# Patient Record
Sex: Male | Born: 1990 | Race: White | Hispanic: No | Marital: Single | Smoking: Never smoker
Health system: Southern US, Community
[De-identification: ages and names within clinical notes are randomized; demographics above are authoritative.]

---
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
## 2012-03-03 ENCOUNTER — Emergency Department (HOSPITAL_COMMUNITY)
Admission: EM | Admit: 2012-03-03 | Discharge: 2012-03-03 | Disposition: A | Discharge status: Home / Self Care | Payer: 59 | Attending: Emergency Medicine | Admitting: Emergency Medicine

## 2012-03-03 ENCOUNTER — Emergency Department (HOSPITAL_COMMUNITY): Payer: 59

## 2012-03-03 ENCOUNTER — Encounter (HOSPITAL_COMMUNITY): Payer: Self-pay | Admitting: *Deleted

## 2012-03-03 DIAGNOSIS — B9789 Other viral agents as the cause of diseases classified elsewhere: Secondary | ICD-10-CM | POA: Insufficient documentation

## 2012-03-03 DIAGNOSIS — B349 Viral infection, unspecified: Secondary | ICD-10-CM

## 2012-03-03 DIAGNOSIS — R109 Unspecified abdominal pain: Secondary | ICD-10-CM | POA: Insufficient documentation

## 2012-03-03 DIAGNOSIS — R110 Nausea: Secondary | ICD-10-CM | POA: Insufficient documentation

## 2012-03-03 DIAGNOSIS — R0602 Shortness of breath: Secondary | ICD-10-CM | POA: Insufficient documentation

## 2012-03-03 DIAGNOSIS — R079 Chest pain, unspecified: Secondary | ICD-10-CM | POA: Insufficient documentation

## 2012-03-03 LAB — LIPASE, BLOOD: Lipase: 29 U/L (ref 11–59)

## 2012-03-03 LAB — BASIC METABOLIC PANEL
CO2: 23 mEq/L (ref 19–32)
Glucose, Bld: 97 mg/dL (ref 70–99)
Potassium: 3.8 mEq/L (ref 3.5–5.1)
Sodium: 135 mEq/L (ref 135–145)

## 2012-03-03 LAB — HEPATIC FUNCTION PANEL
Albumin: 3.9 g/dL (ref 3.5–5.2)
Total Protein: 6.9 g/dL (ref 6.0–8.3)

## 2012-03-03 LAB — CBC
HCT: 45.4 % (ref 39.0–52.0)
Hemoglobin: 16.2 g/dL (ref 13.0–17.0)
MCH: 31 pg (ref 26.0–34.0)
RBC: 5.23 MIL/uL (ref 4.22–5.81)

## 2012-03-03 LAB — POCT I-STAT TROPONIN I

## 2012-03-03 MED ORDER — ONDANSETRON 8 MG PO TBDP: 8.0000 mg | ORAL_TABLET | Freq: Three times a day (TID) | ORAL | Status: DC | PRN

## 2012-03-03 MED ORDER — OXYCODONE-ACETAMINOPHEN 5-325 MG PO TABS: 1.0000 | ORAL_TABLET | Freq: Four times a day (QID) | ORAL | Status: DC | PRN

## 2012-03-03 MED ORDER — KETOROLAC TROMETHAMINE 30 MG/ML IJ SOLN
30.0000 mg | Freq: Once | INTRAMUSCULAR | Status: AC
  Administered 2012-03-03: 30 mg via INTRAVENOUS
  Filled 2012-03-03: qty 1

## 2012-03-03 MED ORDER — SODIUM CHLORIDE 0.9 % IV BOLUS (SEPSIS)
1000.0000 mL | Freq: Once | INTRAVENOUS | Status: AC
  Administered 2012-03-03: 1000 mL via INTRAVENOUS

## 2012-03-03 MED ORDER — FENTANYL CITRATE 0.05 MG/ML IJ SOLN
50.0000 ug | Freq: Once | INTRAMUSCULAR | Status: AC
  Administered 2012-03-03: 50 ug via INTRAVENOUS
  Filled 2012-03-03: qty 2

## 2012-03-03 NOTE — ED Notes (Signed)
Pt c/o cramping in hands; pt c/o numbness and tingling throughout body. Pt mentating appropriately.

## 2012-03-03 NOTE — ED Notes (Signed)
Pt ambulatory upon d/c. Pt leaving with mother. Pt verbalized understanding of d/c teaching and has no further questions upon d/c. Pt does not appear to be in acute distress upon d/c.

## 2012-03-03 NOTE — ED Provider Notes (Signed)
History   This chart was scribed for [employer]. Rubin Payor, MD by Charolett Bumpers, ED Scribe. The patient was seen in room TR09C/TR09C. Patient's care was started at 1540.   CSN: [id number]  Arrival date & time 03/03/12  1259   First MD Initiated Contact with Patient 03/03/12 1540      Chief Complaint  Patient presents with  . Cough  . Chest Pain    The history is provided by the patient. No language interpreter was used.  Willie Gibson is a 21 y.o. male who presents to the Emergency Department complaining of constant, moderate mid sternal chest pain for the past 2 days. He states that the pain later radiated to his upper abdomen and sides. He states that he was seen at [facility] clinic, had a negative chest x-ray and told he had bronchitis and given a breathing treatment. He reports associated SOB, productive cough, nausea, decreased appetite. He denies any fevers, sore throat. He denies any sick contacts. He states that he is otherwise healthy and denies any h/o similar symptoms. He denies tobacco use but reports occasional alcohol use.   History reviewed. No pertinent past medical history.  History reviewed. No pertinent past surgical history.  History reviewed. No pertinent family history.  History  Substance Use Topics  . Smoking status: Not on file  . Smokeless tobacco: Not on file  . Alcohol Use: Yes     Comment: occ      Review of Systems  Constitutional: Positive for appetite change. Negative for fever.  HENT: Negative for sore throat.   Respiratory: Positive for cough and shortness of breath.   Cardiovascular: Positive for chest pain.  Gastrointestinal: Positive for nausea and abdominal pain.  All other systems reviewed and are negative.    Allergies  Review of patient's allergies indicates no known allergies.  Home Medications   Current Outpatient Rx  Name  Route  Sig  Dispense  Refill  . VITAMIN C PO   Oral   Take 1 tablet by mouth daily as needed.  When he remembers         . ONDANSETRON 8 MG PO TBDP   Oral   Take 1 tablet (8 mg total) by mouth every 8 (eight) hours as needed for nausea.   10 tablet   0   . OXYCODONE-ACETAMINOPHEN 5-325 MG PO TABS   Oral   Take 1-2 tablets by mouth every 6 (six) hours as needed for pain.   15 tablet   0     BP 108/64  Pulse 90  Temp 100 F (37.8 C) (Oral)  Resp 16  SpO2 98%  Physical Exam  Nursing note and vitals reviewed. Constitutional: He is oriented to person, place, and time. He appears well-developed and well-nourished.       Appears uncomfortable.   HENT:  Head: Normocephalic and atraumatic.  Mouth/Throat: Posterior oropharyngeal erythema present. No oropharyngeal exudate.  Eyes: EOM are normal.  Neck: Neck supple. No tracheal deviation present.  Cardiovascular: Regular rhythm and normal heart sounds.  Tachycardia present.   No murmur heard. Pulmonary/Chest: Effort normal and breath sounds normal. No respiratory distress. He has no wheezes. He has no rhonchi. He has no rales.  Abdominal: Soft. There is tenderness. There is no rebound and no guarding.       Mild upper abdominal tenderness.   Musculoskeletal: Normal range of motion.  Neurological: He is alert and oriented to person, place, and time.  Skin: Skin is warm  and dry.  Psychiatric: He has a normal mood and affect. His behavior is normal.    ED Course  Procedures (including critical care time)   COORDINATION OF CARE:  16:01-Discussed planned course of treatment with the patient including IV fluids, Toradol, a chest x-ray and blood work, who is agreeable at this time.   16:15-Medication Orders: Sodium chloride 0.9% bolus 1,000 mL-once; Ketorolac (Toradol) 30 mg/mL injection 30 mg-once.   18:05-Recheck: Informed mother in imaging and lab results. Will d/c home. Agreeable with plan at this time.   Results for orders placed during the hospital encounter of 03/03/12  CBC      Component Value Range   WBC 5.7   4.0 - 10.5 K/uL   RBC 5.23  4.22 - 5.81 MIL/uL   Hemoglobin 16.2  13.0 - 17.0 g/dL   HCT 16.1  09.6 - 04.5 %   MCV 86.8  78.0 - 100.0 fL   MCH 31.0  26.0 - 34.0 pg   MCHC 35.7  30.0 - 36.0 g/dL   RDW 40.9  81.1 - 91.4 %   Platelets 185  150 - 400 K/uL  BASIC METABOLIC PANEL      Component Value Range   Sodium 135  135 - 145 mEq/L   Potassium 3.8  3.5 - 5.1 mEq/L   Chloride 100  96 - 112 mEq/L   CO2 23  19 - 32 mEq/L   Glucose, Bld 97  70 - 99 mg/dL   BUN 20  6 - 23 mg/dL   Creatinine, Ser 7.82  0.50 - 1.35 mg/dL   Calcium 9.5  8.4 - 95.6 mg/dL   GFR calc non Af Amer >90  >90 mL/min   GFR calc Af Amer >90  >90 mL/min  POCT I-STAT TROPONIN I      Component Value Range   Troponin i, poc 0.00  0.00 - 0.08 ng/mL   Comment 3           LIPASE, BLOOD      Component Value Range   Lipase 29  11 - 59 U/L  RAPID STREP SCREEN      Component Value Range   Streptococcus, Group A Screen (Direct) NEGATIVE  NEGATIVE  HEPATIC FUNCTION PANEL      Component Value Range   Total Protein 6.9  6.0 - 8.3 g/dL   Albumin 3.9  3.5 - 5.2 g/dL   AST 23  0 - 37 U/L   ALT 21  0 - 53 U/L   Alkaline Phosphatase 53  39 - 117 U/L   Total Bilirubin 0.9  0.3 - 1.2 mg/dL   Bilirubin, Direct 0.1  0.0 - 0.3 mg/dL   Indirect Bilirubin 0.8  0.3 - 0.9 mg/dL    Dg Chest 2 View  21/30/8657  *RADIOLOGY REPORT*  Clinical Data: Cough, chest pain, and shortness of breath.  CHEST - 2 VIEW  Comparison: 03/03/2012  Findings: Pulmonary hyperinflation again noted.  Both lungs are clear.  No evidence of pleural effusion.  Heart size and mediastinal contours are normal.  IMPRESSION: Hyperinflation.  No active disease.   Original Report Authenticated By: Myles Rosenthal, M.D.      1. Viral infection       MDM  Viral symptoms and abdominal pain. Workup reassuring and patients heartrate improved. Will d/c.  I personally performed the services described in this documentation, which was scribed in my presence. The recorded  information has been reviewed and is accurate.  Juliet Rude. Rubin Payor, MD 03/04/12 [phone]

## 2012-03-03 NOTE — ED Notes (Signed)
Pt went to [facility] clinic for chest pain and nausea since Thursday. Started having cough and sob today, had negative chest xray and told it was bronchitis and given breathing tx. Sent here due to pt reporting numbness/tingling to extremities and cramping. Airway intact at triage.

## 2012-04-25 ENCOUNTER — Ambulatory Visit (INDEPENDENT_AMBULATORY_CARE_PROVIDER_SITE_OTHER): Payer: 59 | Admitting: Family Medicine

## 2012-04-25 VITALS — BP 124/76 | HR 58 | Temp 98.4°F | Resp 17 | Ht 69.0 in | Wt 138.0 lb

## 2012-04-25 DIAGNOSIS — Z0000 Encounter for general adult medical examination without abnormal findings: Secondary | ICD-10-CM

## 2012-04-25 NOTE — Progress Notes (Signed)
Complete physical exam:  History: 22 year old male who is here for a complete physical examination. 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  Past medical history: Medication allergies: None Current medications: Noneiew of systems:  Past medical history: Unremarkable except for one trip to the emergency room when he had had strep and chest pain and was pretty ill. Past surgical history: None  Family history: Mother is 57, father 62, in no major health issues.  Social history: Patient does not smoke, drink, or use any illicit drugs. 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 He is single, high school educated, he exercises regularly on a daily basis a couple of hours. He has one regular sexual partner, uses condoms but not 100%  Review of systems: Constitutional: Unremarkable HEENT: Eyes ears nose and throat all normal. Respiratory: Unremarkable Cardiovascular: Unremarkable Gastrointestinal: Normal Genitourinary: Unremarkable Exoskeletal: Unremarkable Neurologic: Unremarkable Dermatologic: Unremarkable Endocrinologic: Unremarkable Psychiatric: Unremarkable  Physical examination: Well-developed well-nourished young man in no major distress. His TMs are normal. Eyes PERRLA. Fundi benign. Throat clear. Neck supple without nodes thyromegaly. Chest is clear to auscultation. Heart regular without murmurs gallops or arrhythmias. Abdomen soft without mass or tenderness. Normal male external genitalia with no hernias. Testes unremarkable. Extremities are normal with good pulses both feet. Deep tendon reflexes brisk. Skin unremarkable.  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

## 2012-04-25 NOTE — Patient Instructions (Signed)
Return if problems

## 2012-04-25 NOTE — Progress Notes (Signed)
  Subjective:    Patient ID: Willie Gibson, male    DOB: 1990/11/15, 22 y.o.   MRN: [id number]  HPI    Review of Systems     Objective:   Physical Exam        Assessment & Plan:

## 2013-03-22 ENCOUNTER — Other Ambulatory Visit: Payer: Self-pay | Admitting: Family Medicine

## 2013-03-22 DIAGNOSIS — R110 Nausea: Secondary | ICD-10-CM

## 2013-03-28 ENCOUNTER — Other Ambulatory Visit: Payer: 59

## 2013-03-28 ENCOUNTER — Ambulatory Visit
Admission: RE | Admit: 2013-03-28 | Discharge: 2013-03-28 | Disposition: A | Discharge status: Home / Self Care | Payer: 59 | Source: Ambulatory Visit | Attending: Family Medicine | Admitting: Family Medicine

## 2013-03-28 DIAGNOSIS — R110 Nausea: Secondary | ICD-10-CM

## 2015-04-12 ENCOUNTER — Emergency Department (HOSPITAL_COMMUNITY): Payer: Commercial Managed Care - HMO

## 2015-04-12 ENCOUNTER — Encounter (HOSPITAL_COMMUNITY): Payer: Self-pay | Admitting: Emergency Medicine

## 2015-04-12 ENCOUNTER — Emergency Department (HOSPITAL_COMMUNITY)
Admission: EM | Admit: 2015-04-12 | Discharge: 2015-04-12 | Disposition: A | Discharge status: Home / Self Care | Payer: Commercial Managed Care - HMO | Attending: Emergency Medicine | Admitting: Emergency Medicine

## 2015-04-12 DIAGNOSIS — S79911A Unspecified injury of right hip, initial encounter: Secondary | ICD-10-CM | POA: Insufficient documentation

## 2015-04-12 DIAGNOSIS — Y92410 Unspecified street and highway as the place of occurrence of the external cause: Secondary | ICD-10-CM | POA: Diagnosis not present

## 2015-04-12 DIAGNOSIS — R52 Pain, unspecified: Secondary | ICD-10-CM

## 2015-04-12 DIAGNOSIS — S29002A Unspecified injury of muscle and tendon of back wall of thorax, initial encounter: Secondary | ICD-10-CM | POA: Diagnosis not present

## 2015-04-12 DIAGNOSIS — S199XXA Unspecified injury of neck, initial encounter: Secondary | ICD-10-CM | POA: Insufficient documentation

## 2015-04-12 DIAGNOSIS — Y998 Other external cause status: Secondary | ICD-10-CM | POA: Insufficient documentation

## 2015-04-12 DIAGNOSIS — M25551 Pain in right hip: Secondary | ICD-10-CM

## 2015-04-12 DIAGNOSIS — M546 Pain in thoracic spine: Secondary | ICD-10-CM

## 2015-04-12 DIAGNOSIS — Y9389 Activity, other specified: Secondary | ICD-10-CM | POA: Diagnosis not present

## 2015-04-12 MED ORDER — CYCLOBENZAPRINE HCL 10 MG PO TABS: 10.0000 mg | ORAL_TABLET | Freq: Two times a day (BID) | ORAL | Status: DC | PRN

## 2015-04-12 MED ORDER — IBUPROFEN 600 MG PO TABS: 600.0000 mg | ORAL_TABLET | Freq: Four times a day (QID) | ORAL | Status: DC | PRN

## 2015-04-12 MED ORDER — HYDROCODONE-ACETAMINOPHEN 5-325 MG PO TABS: 2.0000 | ORAL_TABLET | ORAL | Status: DC | PRN
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
## 2015-04-12 NOTE — ED Notes (Signed)
Pt placed into gown and on to monitor upon arrival to room. Pt monitored by blood pressure and pulse ox.  
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
## 2015-04-12 NOTE — Discharge Instructions (Signed)

## 2015-12-15 ENCOUNTER — Other Ambulatory Visit: Payer: Self-pay | Admitting: Internal Medicine

## 2016-07-22 DIAGNOSIS — L259 Unspecified contact dermatitis, unspecified cause: Secondary | ICD-10-CM | POA: Diagnosis not present

## 2016-08-12 DIAGNOSIS — L237 Allergic contact dermatitis due to plants, except food: Secondary | ICD-10-CM | POA: Diagnosis not present

## 2017-03-29 DIAGNOSIS — J069 Acute upper respiratory infection, unspecified: Secondary | ICD-10-CM | POA: Diagnosis not present

## 2017-04-22 DIAGNOSIS — J111 Influenza due to unidentified influenza virus with other respiratory manifestations: Secondary | ICD-10-CM | POA: Diagnosis not present

## 2017-05-17 DIAGNOSIS — R071 Chest pain on breathing: Secondary | ICD-10-CM | POA: Diagnosis not present

## 2017-06-01 DIAGNOSIS — J209 Acute bronchitis, unspecified: Secondary | ICD-10-CM | POA: Diagnosis not present

## 2017-07-24 DIAGNOSIS — K5909 Other constipation: Secondary | ICD-10-CM | POA: Diagnosis not present

## 2017-09-01 DIAGNOSIS — R1013 Epigastric pain: Secondary | ICD-10-CM | POA: Diagnosis not present

## 2017-09-01 DIAGNOSIS — K5900 Constipation, unspecified: Secondary | ICD-10-CM | POA: Diagnosis not present

## 2017-10-07 DIAGNOSIS — K1230 Oral mucositis (ulcerative), unspecified: Secondary | ICD-10-CM | POA: Diagnosis not present

## 2017-10-11 DIAGNOSIS — K120 Recurrent oral aphthae: Secondary | ICD-10-CM | POA: Diagnosis not present

## 2017-12-27 ENCOUNTER — Other Ambulatory Visit: Payer: Self-pay | Admitting: Physician Assistant

## 2017-12-27 DIAGNOSIS — R1013 Epigastric pain: Secondary | ICD-10-CM

## 2017-12-27 DIAGNOSIS — R1084 Generalized abdominal pain: Secondary | ICD-10-CM | POA: Diagnosis not present

## 2017-12-27 DIAGNOSIS — R195 Other fecal abnormalities: Secondary | ICD-10-CM | POA: Diagnosis not present

## 2018-01-02 ENCOUNTER — Other Ambulatory Visit: Payer: Commercial Managed Care - HMO

## 2018-01-04 ENCOUNTER — Ambulatory Visit
Admission: RE | Admit: 2018-01-04 | Discharge: 2018-01-04 | Disposition: A | Discharge status: Home / Self Care | Payer: 59 | Source: Ambulatory Visit | Attending: Physician Assistant | Admitting: Physician Assistant

## 2018-01-04 DIAGNOSIS — R1013 Epigastric pain: Secondary | ICD-10-CM

## 2018-01-04 DIAGNOSIS — R1010 Upper abdominal pain, unspecified: Secondary | ICD-10-CM | POA: Diagnosis not present

## 2018-01-31 DIAGNOSIS — J069 Acute upper respiratory infection, unspecified: Secondary | ICD-10-CM | POA: Diagnosis not present

## 2018-04-11 DIAGNOSIS — J0100 Acute maxillary sinusitis, unspecified: Secondary | ICD-10-CM | POA: Diagnosis not present

## 2018-05-21 DIAGNOSIS — R1084 Generalized abdominal pain: Secondary | ICD-10-CM | POA: Diagnosis not present

## 2018-05-21 DIAGNOSIS — K625 Hemorrhage of anus and rectum: Secondary | ICD-10-CM | POA: Diagnosis not present

## 2018-05-21 DIAGNOSIS — R1013 Epigastric pain: Secondary | ICD-10-CM | POA: Diagnosis not present

## 2018-06-04 ENCOUNTER — Other Ambulatory Visit: Payer: Self-pay | Admitting: Physician Assistant

## 2018-06-04 DIAGNOSIS — K625 Hemorrhage of anus and rectum: Secondary | ICD-10-CM

## 2018-06-04 DIAGNOSIS — R1013 Epigastric pain: Secondary | ICD-10-CM

## 2018-06-04 DIAGNOSIS — R634 Abnormal weight loss: Secondary | ICD-10-CM

## 2018-06-04 DIAGNOSIS — R1084 Generalized abdominal pain: Secondary | ICD-10-CM

## 2018-06-08 ENCOUNTER — Ambulatory Visit
Admission: RE | Admit: 2018-06-08 | Discharge: 2018-06-08 | Disposition: A | Discharge status: Home / Self Care | Payer: 59 | Source: Ambulatory Visit | Attending: Physician Assistant | Admitting: Physician Assistant

## 2018-06-08 ENCOUNTER — Other Ambulatory Visit: Payer: Self-pay

## 2018-06-08 DIAGNOSIS — K625 Hemorrhage of anus and rectum: Secondary | ICD-10-CM

## 2018-06-08 DIAGNOSIS — R634 Abnormal weight loss: Secondary | ICD-10-CM

## 2018-06-08 DIAGNOSIS — R1013 Epigastric pain: Secondary | ICD-10-CM | POA: Diagnosis not present

## 2018-06-08 DIAGNOSIS — R1084 Generalized abdominal pain: Secondary | ICD-10-CM

## 2018-06-08 MED ORDER — IOPAMIDOL (ISOVUE-300) INJECTION 61%
100.0000 mL | Freq: Once | INTRAVENOUS | Status: AC | PRN
  Administered 2018-06-08: 100 mL via INTRAVENOUS

## 2021-05-24 ENCOUNTER — Other Ambulatory Visit: Payer: Self-pay | Admitting: Family Medicine

## 2021-05-24 ENCOUNTER — Ambulatory Visit
Admission: RE | Admit: 2021-05-24 | Discharge: 2021-05-24 | Disposition: A | Discharge status: Home / Self Care | Payer: BC Managed Care – PPO | Source: Ambulatory Visit | Attending: Family Medicine | Admitting: Family Medicine

## 2021-05-24 DIAGNOSIS — R6884 Jaw pain: Secondary | ICD-10-CM

## 2022-08-18 ENCOUNTER — Ambulatory Visit
Admission: EM | Admit: 2022-08-18 | Discharge: 2022-08-18 | Disposition: A | Discharge status: Home / Self Care | Payer: BC Managed Care – PPO | Attending: Family Medicine | Admitting: Family Medicine

## 2022-08-18 ENCOUNTER — Telehealth: Payer: Self-pay

## 2022-08-18 DIAGNOSIS — B029 Zoster without complications: Secondary | ICD-10-CM | POA: Diagnosis not present

## 2022-08-18 MED ORDER — VALACYCLOVIR HCL 1 G PO TABS: 1000.0000 mg | ORAL_TABLET | Freq: Three times a day (TID) | ORAL | 0 refills | Status: DC

## 2022-08-18 MED ORDER — IBUPROFEN 800 MG PO TABS: 800.0000 mg | ORAL_TABLET | Freq: Three times a day (TID) | ORAL | 0 refills | Status: DC | PRN

## 2022-08-18 MED ORDER — VALACYCLOVIR HCL 1 G PO TABS: 1000.0000 mg | ORAL_TABLET | Freq: Three times a day (TID) | ORAL | 0 refills | Status: AC

## 2022-08-18 NOTE — ED Provider Notes (Signed)
[facility]    CSN: [id number] Arrival date & time: 08/18/22  1633      History   Chief Complaint Chief Complaint  Patient presents with   Rash    HPI Willie Gibson is a 32 y.o. male.    Rash  Here for rash on his left back.  Is been there since about 5 or 6 days ago.  It has been itching and tingling some and hurting.  He thought it may be was poison ivy.  History reviewed. No pertinent past medical history.  There are no problems to display for this patient.   History reviewed. No pertinent surgical history.     Home Medications    Prior to Admission medications   Medication Sig Start Date End Date Taking? Authorizing Provider  ibuprofen (ADVIL) 800 MG tablet Take 1 tablet (800 mg total) by mouth every 8 (eight) hours as needed (pain). 08/18/22  Yes Delynda Sepulveda, Janace Aris, MD  valACYclovir (VALTREX) 1000 MG tablet Take 1 tablet (1,000 mg total) by mouth 3 (three) times daily for 7 days. 08/18/22 08/25/22 Yes Zenia Resides, MD    Family History History reviewed. No pertinent family history.  Social History Social History   Tobacco Use   Smoking status: Never   Smokeless tobacco: Never  Substance Use Topics   Alcohol use: No    Comment: occ   Drug use: No     Allergies   Patient has no known allergies.   Review of Systems Review of Systems  Skin:  Positive for rash.     Physical Exam Triage Vital Signs ED Triage Vitals [08/18/22 1742]  Enc Vitals Group     BP 114/70     Pulse Rate 72     Resp 18     Temp 98 F (36.7 C)     Temp Source Oral     SpO2 98 %     Weight      Height      Head Circumference      Peak Flow      Pain Score      Pain Loc      Pain Edu?      Excl. in GC?    No data found.  Updated Vital Signs BP 114/70 (BP Location: Left Arm)   Pulse 72   Temp 98 F (36.7 C) (Oral)   Resp 18   SpO2 98%   Visual Acuity Right Eye Distance:   Left Eye Distance:   Bilateral Distance:    Right Eye  Near:   Left Eye Near:    Bilateral Near:     Physical Exam Vitals reviewed.  Constitutional:      General: He is not in acute distress.    Appearance: He is not ill-appearing, toxic-appearing or diaphoretic.  HENT:     Mouth/Throat:     Mouth: Mucous membranes are moist.  Skin:    Coloration: Skin is not pale.     Comments: There is a vesicular clustered rash in his left lower thoracic area.  It is consistent with shingles.  Neurological:     General: No focal deficit present.     Mental Status: He is alert and oriented to person, place, and time.  Psychiatric:        Behavior: Behavior normal.      UC Treatments / Results  Labs (all labs ordered are listed, but only abnormal results are displayed) Labs Reviewed - No data  to display  EKG   Radiology No results found.  Procedures Procedures (including critical care time)  Medications Ordered in UC Medications - No data to display  Initial Impression / Assessment and Plan / UC Course  I have reviewed the triage vital signs and the nursing notes.  Pertinent labs & imaging results that were available during my care of the patient were reviewed by me and considered in my medical decision making (see chart for details).        I discussed with him that most likely this is too late for the antivirals to make a difference, but we did send in valacyclovir and ibuprofen as needed for pain. Final Clinical Impressions(s) / UC Diagnoses   Final diagnoses:  Herpes zoster without complication     Discharge Instructions      Take valacyclovir 1000 mg--1 tablet 3 times daily for 7 days  Take ibuprofen 800 mg--1 tab every 8 hours as needed for pain.       ED Prescriptions     Medication Sig Dispense Auth. Provider   valACYclovir (VALTREX) 1000 MG tablet Take 1 tablet (1,000 mg total) by mouth 3 (three) times daily for 7 days. 21 tablet Zenia Resides, MD   ibuprofen (ADVIL) 800 MG tablet Take 1 tablet  (800 mg total) by mouth every 8 (eight) hours as needed (pain). 21 tablet Skyelar Halliday, Janace Aris, MD      PDMP not reviewed this encounter.   Zenia Resides, MD 08/18/22 1754

## 2022-08-18 NOTE — Discharge Instructions (Signed)
Take valacyclovir 1000 mg--1 tablet 3 times daily for 7 days  Take ibuprofen 800 mg--1 tab every 8 hours as needed for pain.

## 2022-08-18 NOTE — ED Triage Notes (Signed)
Pt is here with 2 week rash on his back. Pt thinks its poison ivy.
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
## 2023-12-20 ENCOUNTER — Encounter: Payer: Self-pay | Admitting: Emergency Medicine

## 2023-12-20 ENCOUNTER — Ambulatory Visit
Admission: EM | Admit: 2023-12-20 | Discharge: 2023-12-20 | Disposition: A | Discharge status: Home / Self Care | Attending: Internal Medicine | Admitting: Internal Medicine

## 2023-12-20 DIAGNOSIS — R0789 Other chest pain: Secondary | ICD-10-CM | POA: Diagnosis not present

## 2023-12-20 MED ORDER — NAPROXEN 500 MG PO TABS: 500.0000 mg | ORAL_TABLET | Freq: Two times a day (BID) | ORAL | 0 refills | Status: AC

## 2023-12-20 NOTE — Discharge Instructions (Addendum)
 Take naproxen every 12 hours as needed for rib pain with food.  Heat 20 minutes on 20 minutes off as needed.  Stretches.   If you develop any new or worsening symptoms or if your symptoms do not start to improve, please return here or follow-up with your primary care provider. If your symptoms are severe, please go to the emergency room.

## 2023-12-20 NOTE — ED Notes (Signed)
 Equal breath sounds through out

## 2023-12-20 NOTE — ED Provider Notes (Signed)
 [facility]    CSN: [id number] Arrival date & time: 12/20/23  0857      History   Chief Complaint Chief Complaint  Patient presents with   Rib Injury    HPI Willie Gibson is a 33 y.o. male.   Willie Gibson is a 33 y.o. male presenting for chief complaint of right ribcage pain that started yesterday after he was required to be in an awkward position while helping his parents fix their washer/dryer. He was leaned over the washer/dryer and started having pain to the right ribcage. Denies direct blunt trauma/injuries to the right ribcage, popping sensation to the rib, cough, fever/chills, bruising to the overlying skin, rash, shortness of breath, chest pain, heart palpitations, and swelling to the area. He has never injured the right ribcage in the past and has not attempted use of any OTC medications for symptoms PTA.      History reviewed. No pertinent past medical history.  There are no active problems to display for this patient.   History reviewed. No pertinent surgical history.     Home Medications    Prior to Admission medications   Medication Sig Start Date End Date Taking? Authorizing Provider  naproxen (NAPROSYN) 500 MG tablet Take 1 tablet (500 mg total) by mouth 2 (two) times daily. 12/20/23  Yes Enedelia Dorna HERO, FNP  ibuprofen  (ADVIL ) 800 MG tablet Take 1 tablet (800 mg total) by mouth every 8 (eight) hours as needed (pain). 08/18/22   Vonna Sharlet POUR, MD    Family History No family history on file.  Social History Social History   Tobacco Use   Smoking status: Never   Smokeless tobacco: Never  Vaping Use   Vaping status: Never Used  Substance Use Topics   Alcohol use: No    Comment: occ   Drug use: No     Allergies   Patient has no known allergies.   Review of Systems Review of Systems Per HPI  Physical Exam Triage Vital Signs ED Triage Vitals  Encounter Vitals Group     BP 12/20/23 0910 116/74     Girls  Systolic BP Percentile --      Girls Diastolic BP Percentile --      Boys Systolic BP Percentile --      Boys Diastolic BP Percentile --      Pulse Rate 12/20/23 0910 (!) 58     Resp 12/20/23 0910 18     Temp 12/20/23 0910 98.1 F (36.7 C)     Temp Source 12/20/23 0910 Oral     SpO2 12/20/23 0910 95 %     Weight 12/20/23 0911 160 lb (72.6 kg)     Height 12/20/23 0911 5' 9 (1.753 m)     Head Circumference --      Peak Flow --      Pain Score 12/20/23 0911 4     Pain Loc --      Pain Education --      Exclude from Growth Chart --    No data found.  Updated Vital Signs BP 116/74 (BP Location: Left Arm)   Pulse (!) 58   Temp 98.1 F (36.7 C) (Oral)   Resp 18   Ht 5' 9 (1.753 m)   Wt 160 lb (72.6 kg)   SpO2 95%   BMI 23.63 kg/m   Visual Acuity Right Eye Distance:   Left Eye Distance:   Bilateral Distance:    Right Eye Near:  Left Eye Near:    Bilateral Near:     Physical Exam Vitals and nursing note reviewed.  Constitutional:      Appearance: He is not ill-appearing or toxic-appearing.  HENT:     Head: Normocephalic and atraumatic.     Right Ear: Hearing and external ear normal.     Left Ear: Hearing and external ear normal.     Nose: Nose normal.     Mouth/Throat:     Lips: Pink.  Eyes:     General: Lids are normal. Vision grossly intact. Gaze aligned appropriately.     Extraocular Movements: Extraocular movements intact.     Conjunctiva/sclera: Conjunctivae normal.  Cardiovascular:     Rate and Rhythm: Normal rate and regular rhythm.     Heart sounds: Normal heart sounds, S1 normal and S2 normal.  Pulmonary:     Effort: Pulmonary effort is normal. No respiratory distress.     Breath sounds: Normal breath sounds and air entry.  Chest:     Chest wall: Tenderness present. No mass, lacerations, deformity, swelling, crepitus or edema. There is no dullness to percussion.  Abdominal:   Musculoskeletal:     Cervical back: Neck supple.  Skin:    General:  Skin is warm and dry.     Capillary Refill: Capillary refill takes less than 2 seconds.     Findings: No rash.  Neurological:     General: No focal deficit present.     Mental Status: He is alert and oriented to person, place, and time. Mental status is at baseline.     Cranial Nerves: No dysarthria or facial asymmetry.  Psychiatric:        Mood and Affect: Mood normal.        Speech: Speech normal.        Behavior: Behavior normal.        Thought Content: Thought content normal.        Judgment: Judgment normal.      UC Treatments / Results  Labs (all labs ordered are listed, but only abnormal results are displayed) Labs Reviewed - No data to display  EKG   Radiology No results found.  Procedures Procedures (including critical care time)  Medications Ordered in UC Medications - No data to display  Initial Impression / Assessment and Plan / UC Course  I have reviewed the triage vital signs and the nursing notes.  Pertinent labs & imaging results that were available during my care of the patient were reviewed by me and considered in my medical decision making (see chart for details).   1. Rib pain on right side Rib cage pain on right side due to mild contusion.  No signs of injury, low suspicion for rib fracture given atraumatic  mechanism of injury, therefore rib films were not performed.  Will treat with naproxen BID PRN, warm compresses, stretches.  Counseled patient on potential for adverse effects with medications prescribed/recommended today, strict ER and return-to-clinic precautions discussed, patient verbalized understanding.    Final Clinical Impressions(s) / UC Diagnoses   Final diagnoses:  Rib pain on right side     Discharge Instructions      Take naproxen every 12 hours as needed for rib pain with food.  Heat 20 minutes on 20 minutes off as needed.  Stretches.   If you develop any new or worsening symptoms or if your symptoms do not start to  improve, please return here or follow-up with your primary care provider. If  your symptoms are severe, please go to the emergency room.     ED Prescriptions     Medication Sig Dispense Auth. Provider   naproxen (NAPROSYN) 500 MG tablet Take 1 tablet (500 mg total) by mouth 2 (two) times daily. 30 tablet Enedelia Dorna HERO, FNP      PDMP not reviewed this encounter.   Enedelia Dorna [place], [place] 12/20/23 [phone]

## 2023-12-20 NOTE — ED Triage Notes (Signed)
 Pt st's he was climbing over a washer and dryer last night and thinks he put too much pressure on his right ribs   St's area has been painful all night   No breathing difficulties noted

## 2024-03-10 ENCOUNTER — Ambulatory Visit (HOSPITAL_COMMUNITY): Admission: EM | Admit: 2024-03-10 | Discharge: 2024-03-10 | Disposition: A | Discharge status: Home / Self Care

## 2024-03-10 ENCOUNTER — Encounter (HOSPITAL_COMMUNITY): Payer: Self-pay | Admitting: Emergency Medicine

## 2024-03-10 DIAGNOSIS — J111 Influenza due to unidentified influenza virus with other respiratory manifestations: Secondary | ICD-10-CM | POA: Diagnosis not present

## 2024-03-10 LAB — POCT INFLUENZA A/B
Influenza A, POC: NEGATIVE
Influenza B, POC: NEGATIVE

## 2024-03-10 LAB — POC SOFIA SARS ANTIGEN FIA: SARS Coronavirus 2 Ag: NEGATIVE

## 2024-03-10 MED ORDER — IBUPROFEN 800 MG PO TABS: 800.0000 mg | ORAL_TABLET | Freq: Three times a day (TID) | ORAL | 0 refills | Status: AC | PRN

## 2024-03-10 MED ORDER — AZELASTINE HCL 0.1 % NA SOLN: 1.0000 | Freq: Two times a day (BID) | NASAL | 1 refills | Status: AC

## 2024-03-10 MED ORDER — PROMETHAZINE-DM 6.25-15 MG/5ML PO SYRP: 10.0000 mL | ORAL_SOLUTION | Freq: Three times a day (TID) | ORAL | 0 refills | Status: AC | PRN

## 2024-03-10 NOTE — ED Triage Notes (Signed)
 Patient has a cough, and body aches that started this am.  Patient has not taken any medication

## 2024-03-10 NOTE — ED Provider Notes (Signed)
 " [facility]  Note:  This document was prepared using Dragon voice recognition software and may include unintentional dictation errors.  MRN: [id number] DOB: 1991/02/10  Subjective:   Willie Gibson is a 34 y.o. male presenting for evaluation of cough, body aches, headache since this morning.  Patient denies taking any over-the-counter medication to treat symptoms prior to arrival in urgent care.  Denies any known sick contacts.  No shortness of breath, chest pain, weakness, dizziness, fever noted by patient.  Current Medications[1]   Allergies[2]  History reviewed. No pertinent past medical history.   History reviewed. No pertinent surgical history.  History reviewed. No pertinent family history.  Social History[3]  ROS Refer to HPI for ROS details.  Objective:    Vitals: BP (!) 146/88 (BP Location: Left Arm)   Pulse 71   Temp 98.4 F (36.9 C) (Oral)   Resp 18   SpO2 98%   Physical Exam Vitals and nursing note reviewed.  Constitutional:      General: He is not in acute distress.    Appearance: Normal appearance. He is well-developed. He is not ill-appearing or toxic-appearing.  HENT:     Head: Normocephalic.     Nose: Nose normal. No congestion.     Mouth/Throat:     Mouth: Mucous membranes are moist.     Pharynx: Oropharynx is clear.  Cardiovascular:     Rate and Rhythm: Normal rate.  Pulmonary:     Effort: Pulmonary effort is normal. No respiratory distress.     Breath sounds: No stridor. No wheezing.  Skin:    General: Skin is warm and dry.  Neurological:     General: No focal deficit present.     Mental Status: He is alert and oriented to person, place, and time.  Psychiatric:        Mood and Affect: Mood normal.        Behavior: Behavior normal.     Procedures  Results for orders placed or performed during the hospital encounter of 03/10/24 (from the past 24 hours)  POC Influenza A/B     Status: None   Collection Time: 03/10/24   5:06 PM  Result Value Ref Range   Influenza A, POC Negative Negative   Influenza B, POC Negative Negative  POC SARS Coronavirus Ag     Status: None   Collection Time: 03/10/24  5:06 PM  Result Value Ref Range   SARS Coronavirus 2 Ag Negative Negative    Assessment and Plan :     Discharge Instructions       1. Influenza-like illness (Primary) - POC Influenza A/B completed today in UC is negative for influenza - POC SARS Coronavirus Ag completed today in UC is negative for COVID. - ibuprofen  (ADVIL ) 800 MG tablet; Take 1 tablet (800 mg total) by mouth every 8 (eight) hours as needed for moderate pain (pain score 4-6), headache or fever (pain).  Dispense: 30 tablet; Refill: 0 - azelastine  (ASTELIN ) 0.1 % nasal spray; Place 1 spray into both nostrils 2 (two) times daily. Use in each nostril as directed  Dispense: 30 mL; Refill: 1 - promethazine -dextromethorphan (PROMETHAZINE -DM) 6.25-15 MG/5ML syrup; Take 10 mLs by mouth 3 (three) times daily as needed for cough.  Dispense: 240 mL; Refill: 0  -Continue to monitor symptoms for any change in severity if there is any escalation of current symptoms or development of new symptoms follow-up in ER for further evaluation and management.      Lexus Barletta  B Donyetta Ogletree    [1] No current facility-administered medications for this encounter.  Current Outpatient Medications:    azelastine  (ASTELIN ) 0.1 % nasal spray, Place 1 spray into both nostrils 2 (two) times daily. Use in each nostril as directed, Disp: 30 mL, Rfl: 1   promethazine -dextromethorphan (PROMETHAZINE -DM) 6.25-15 MG/5ML syrup, Take 10 mLs by mouth 3 (three) times daily as needed for cough., Disp: 240 mL, Rfl: 0   ibuprofen  (ADVIL ) 800 MG tablet, Take 1 tablet (800 mg total) by mouth every 8 (eight) hours as needed for moderate pain (pain score 4-6), headache or fever (pain)., Disp: 30 tablet, Rfl: 0   naproxen  (NAPROSYN ) 500 MG tablet, Take 1 tablet (500 mg total) by mouth 2 (two)  times daily., Disp: 30 tablet, Rfl: 0 [2] No Known Allergies [3]  Social History Tobacco Use   Smoking status: Never   Smokeless tobacco: Never  Vaping Use   Vaping status: Never Used  Substance Use Topics   Alcohol use: No    Comment: occ   Drug use: No     Aurea Goodell B, NP 03/10/24 1734  "

## 2024-03-10 NOTE — Discharge Instructions (Addendum)
" °  1. Influenza-like illness (Primary) - POC Influenza A/B completed today in UC is negative for influenza - POC SARS Coronavirus Ag completed today in UC is negative for COVID. - ibuprofen  (ADVIL ) 800 MG tablet; Take 1 tablet (800 mg total) by mouth every 8 (eight) hours as needed for moderate pain (pain score 4-6), headache or fever (pain).  Dispense: 30 tablet; Refill: 0 - azelastine  (ASTELIN ) 0.1 % nasal spray; Place 1 spray into both nostrils 2 (two) times daily. Use in each nostril as directed  Dispense: 30 mL; Refill: 1 - promethazine -dextromethorphan (PROMETHAZINE -DM) 6.25-15 MG/5ML syrup; Take 10 mLs by mouth 3 (three) times daily as needed for cough.  Dispense: 240 mL; Refill: 0  -Continue to monitor symptoms for any change in severity if there is any escalation of current symptoms or development of new symptoms follow-up in ER for further evaluation and management. "
